# Patient Record
Sex: Male | Born: 1990 | Race: Asian | Hispanic: No | Marital: Single | Smoking: Current every day smoker
Health system: Southern US, Community
[De-identification: ages and names within clinical notes are randomized; demographics above are authoritative.]

---
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
## 2013-02-24 ENCOUNTER — Emergency Department (HOSPITAL_COMMUNITY)
Admission: EM | Admit: 2013-02-24 | Discharge: 2013-02-24 | Disposition: A | Discharge status: Home / Self Care | Payer: BC Managed Care – PPO | Attending: Emergency Medicine | Admitting: Emergency Medicine

## 2013-02-24 ENCOUNTER — Encounter (HOSPITAL_COMMUNITY): Payer: Self-pay | Admitting: Emergency Medicine

## 2013-02-24 ENCOUNTER — Emergency Department (HOSPITAL_COMMUNITY): Payer: BC Managed Care – PPO

## 2013-02-24 DIAGNOSIS — F17200 Nicotine dependence, unspecified, uncomplicated: Secondary | ICD-10-CM | POA: Insufficient documentation

## 2013-02-24 DIAGNOSIS — R269 Unspecified abnormalities of gait and mobility: Secondary | ICD-10-CM | POA: Insufficient documentation

## 2013-02-24 DIAGNOSIS — M25579 Pain in unspecified ankle and joints of unspecified foot: Secondary | ICD-10-CM | POA: Insufficient documentation

## 2013-02-24 DIAGNOSIS — M79672 Pain in left foot: Secondary | ICD-10-CM

## 2013-02-24 MED ORDER — HYDROCODONE-ACETAMINOPHEN 5-325 MG PO TABS: 1.0000 | ORAL_TABLET | Freq: Three times a day (TID) | ORAL | Status: AC

## 2013-02-24 MED ORDER — HYDROCODONE-ACETAMINOPHEN 5-325 MG PO TABS
1.0000 | ORAL_TABLET | Freq: Once | ORAL | Status: AC
  Administered 2013-02-24: 1 via ORAL
  Filled 2013-02-24: qty 1

## 2013-02-24 NOTE — ED Notes (Signed)
C/O pain left medial foot with walking, flexion and extension.No known injury. No deformity noted. Strong pedal pulse.

## 2013-02-24 NOTE — ED Provider Notes (Signed)
CSN: [id number]     Arrival date & time 02/24/13  8657 History  This chart was scribed for non-physician practitioner Mellody Drown, PA-C working with Layla Maw Ward, DO by Joaquin Music, ED Scribe. This patient was seen in room TR10C/TR10C and the patient's care was started at 12:04 PM .   Chief Complaint  Patient presents with  . Ankle Pain   The history is provided by the patient. No language interpreter was used.   HPI Comments: Adam Barton is a 22 y.o. male who presents to the Emergency Department complaining of ongoing L foot and ankle pain that began 4 days ago. He reports Left discomfort located in the L instep of foot radiating to medial ankle. Pt states he was working when his pain initially started. He reports he was was unable to bear weight to L leg due to sharp shooting pain up his L ankle. He states he wears "regular dress shoes for work". Pt states he has been taking OTC Ibuprofen every 8 hours for the past 3 days with little to no relief.  Pt denies any recent traumas/injuries or hx of injuries to L leg. No rash or fever.   Pt states his PCP is Maurice Small.  History reviewed. No pertinent past medical history. History reviewed. No pertinent past surgical history. History reviewed. No pertinent family history. History  Substance Use Topics  . Smoking status: Current Every Day Smoker  . Smokeless tobacco: Not on file  . Alcohol Use: Yes    Review of Systems  Musculoskeletal: Positive for gait problem.   Allergies  Review of patient's allergies indicates no known allergies.  Home Medications   Current Outpatient Rx  Name  Route  Sig  Dispense  Refill  . HYDROcodone-acetaminophen (NORCO/VICODIN) 5-325 MG per tablet   Oral   Take 1 tablet by mouth 3 (three) times daily with meals.   10 tablet   0     BP 120/72  Pulse 67  Temp(Src) 97.4 F (36.3 C) (Oral)  Resp 18  Wt 250 lb (113.399 kg)  SpO2 95%  Physical Exam  Nursing note and  vitals reviewed. Constitutional: He is oriented to person, place, and time. He appears well-developed and well-nourished. No distress.  HENT:  Head: Normocephalic and atraumatic.  Neck: Neck supple.  Pulmonary/Chest: Effort normal. No respiratory distress.  Musculoskeletal:       Left ankle: He exhibits normal range of motion and no swelling. Achilles tendon exhibits no pain.       Left foot: He exhibits normal range of motion, no swelling, no deformity and no laceration.  Non-tender to palpation, no sign of infection.  Neurological: He is oriented to person, place, and time.  Skin: Skin is warm and dry.  Psychiatric: He has a normal mood and affect. His behavior is normal.    ED Course  Procedures  DIAGNOSTIC STUDIES: Oxygen Saturation is 95% on RA, normal by my interpretation.    COORDINATION OF CARE: 12:08 PM-Discussed treatment plan which includes advise pt to ice area, use Ibuprofen for relief, and to use shoes with good arch support. Refer pt to Podiatrist. Advised pt to F/U with PCP. Pt agreed to plan.   Labs Review Labs Reviewed - No data to display Imaging Review No results found.  EKG Interpretation   None       MDM   1. Left foot pain    Pt with left foot pain for several days. No history of injury.  XR-without fracture. Discussed imaging results, and treatment plan with the patient. Return precautions given. Reports understanding and no other concerns at this time.  Patient is stable for discharge at this time.  Meds given in ED:  Medications  HYDROcodone-acetaminophen (NORCO/VICODIN) 5-325 MG per tablet 1 tablet (1 tablet Oral Given 02/24/13 1219)    Discharge Medication List as of 02/24/2013 12:14 PM    START taking these medications   Details  HYDROcodone-acetaminophen (NORCO/VICODIN) 5-325 MG per tablet Take 1 tablet by mouth 3 (three) times daily with meals., Starting 02/24/2013, Until Discontinued, Print        I personally performed the  services described in this documentation, which was scribed in my presence. The recorded information has been reviewed and is accurate.     Clabe Seal, PA-C 02/26/13 [phone]

## 2013-02-24 NOTE — ED Notes (Signed)
Per pt sts for the past few days he has had some worsening ankle pain and selling. sts left ankle. Denies injury

## 2013-02-27 NOTE — ED Provider Notes (Signed)
Medical screening examination/treatment/procedure(s) were performed by non-physician practitioner and as supervising physician I was immediately available for consultation/collaboration.  EKG Interpretation   None         Amaad Byers N Debanhi Blaker, DO 02/27/13 0713 

## 2014-06-29 IMAGING — CR DG ANKLE COMPLETE 3+V*L*
3 series · 3 of 3 positions shown · non-contrast
Comparison: None

CLINICAL DATA: Pain and swelling, no injury, unable to bear weight

EXAM:
LEFT ANKLE COMPLETE - 3+ VIEW

[t ankle joint ap left]
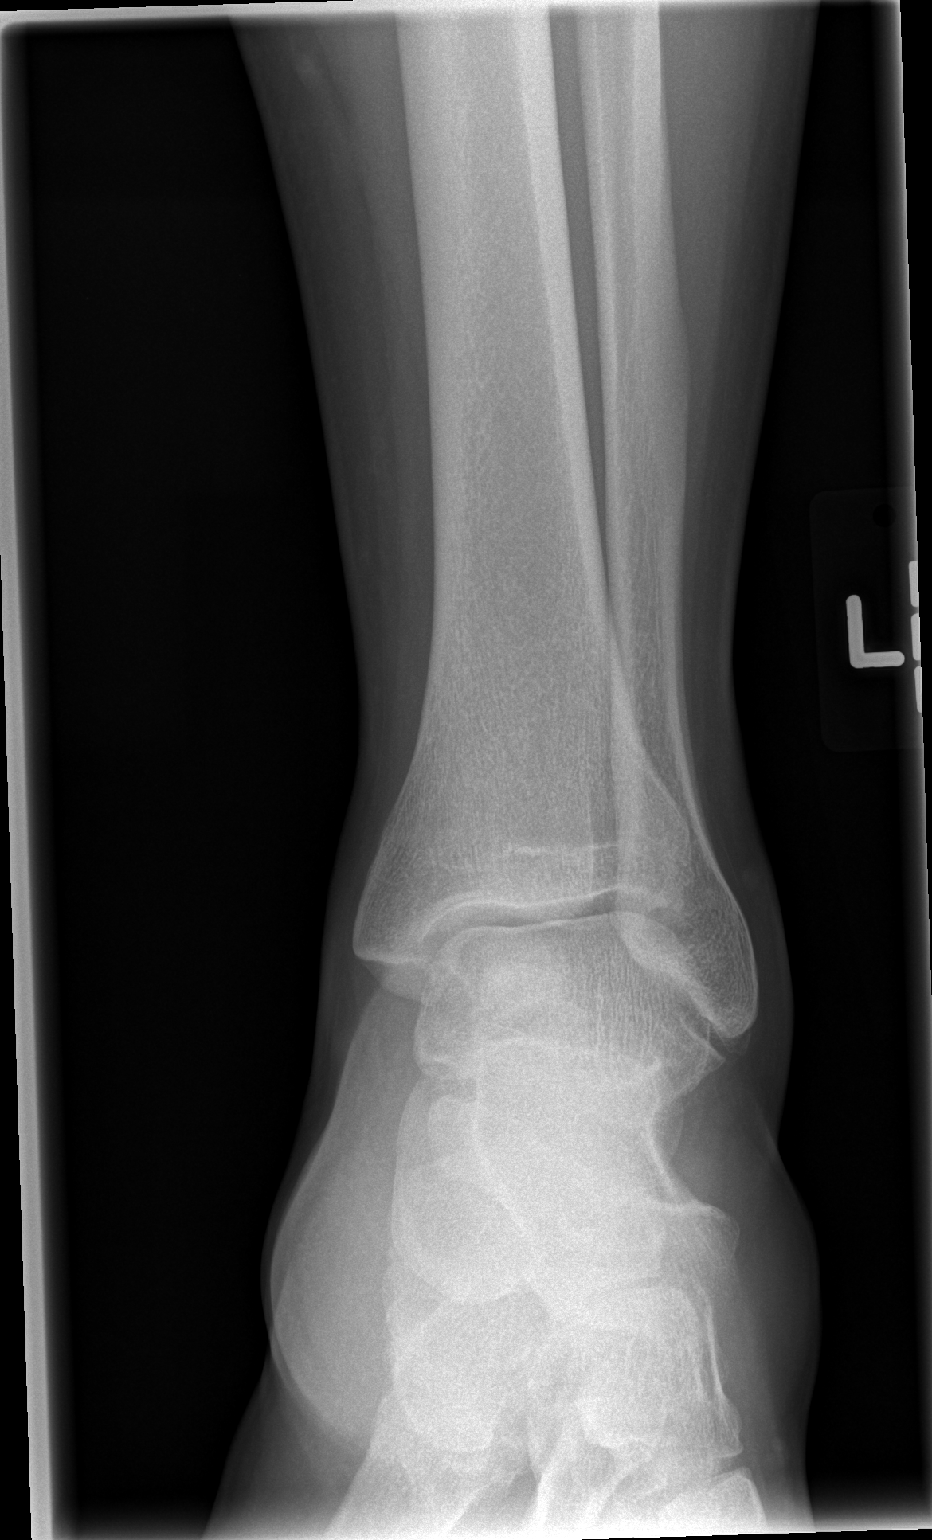

[t ankle joint oblique left]
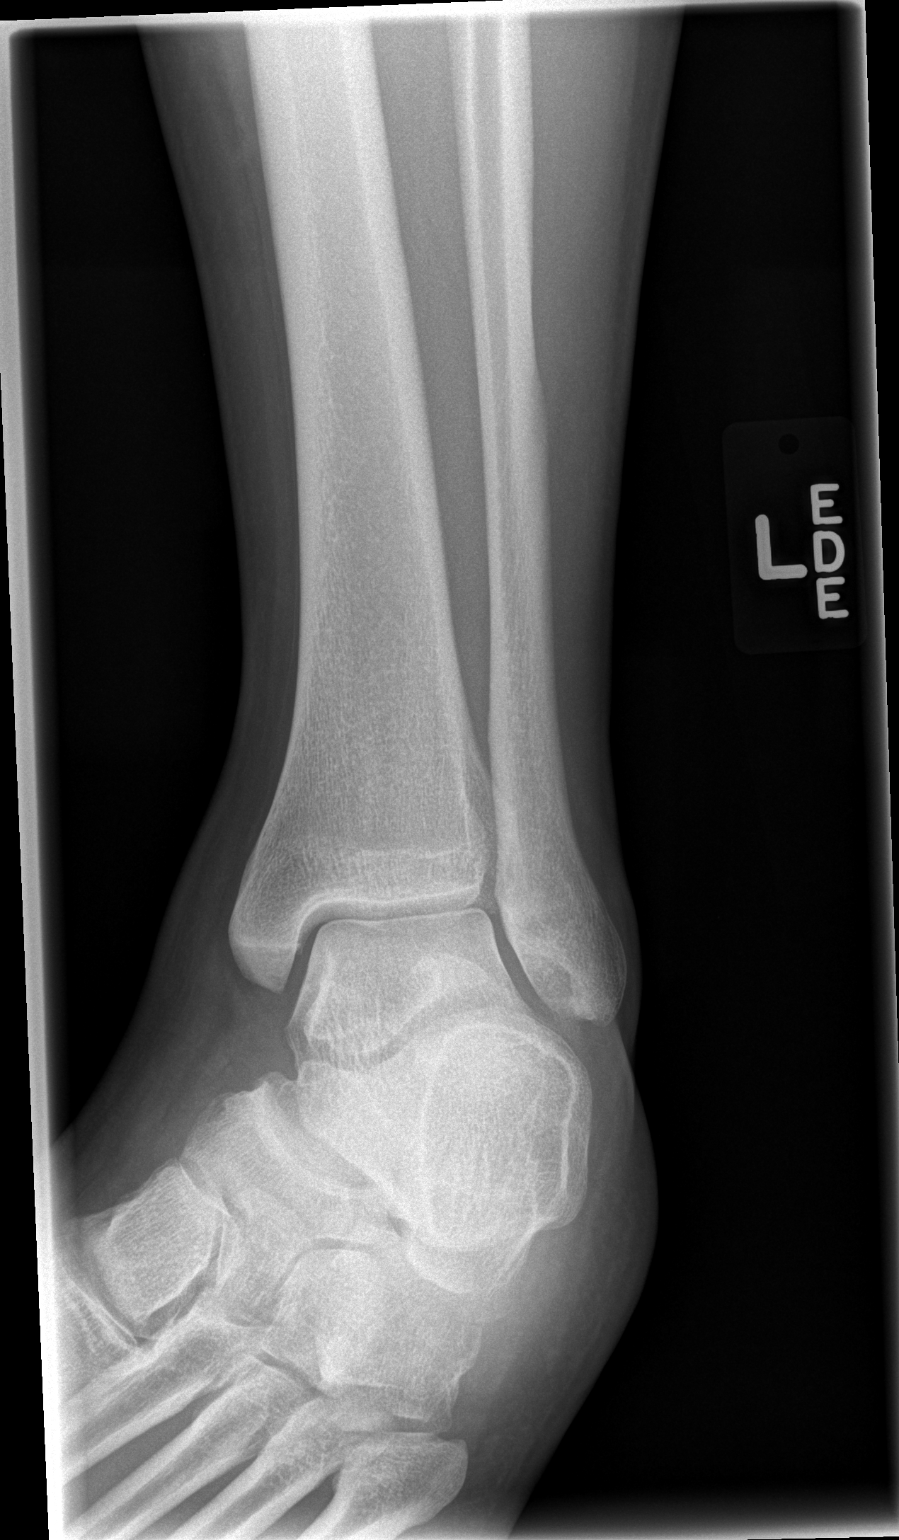

[t ankle joint lat left]
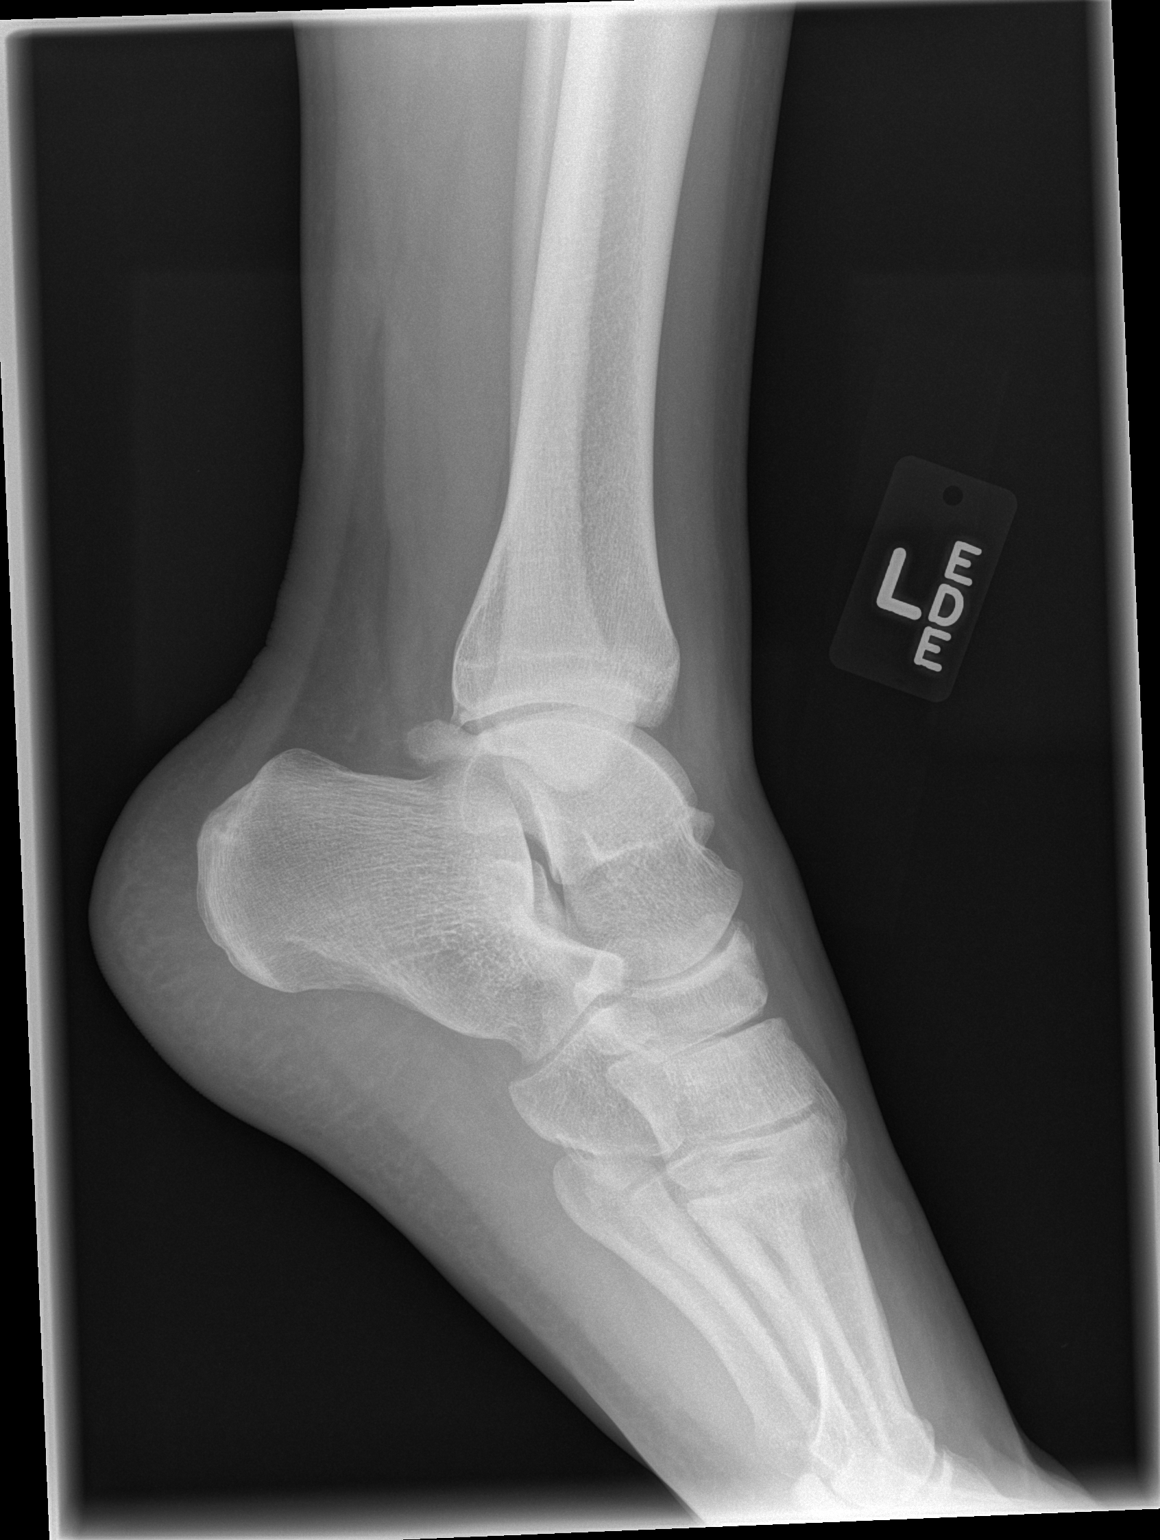

[3 of 3 positions shown; findings below may reference images not displayed]

FINDINGS: Mild soft tissue swelling.

Ankle mortise intact.

Osseous mineralization normal.

No acute fracture, dislocation or bone destruction.
IMPRESSION: No acute osseous abnormalities.

## 2020-03-18 ENCOUNTER — Other Ambulatory Visit: Payer: Self-pay

## 2020-03-18 ENCOUNTER — Emergency Department (HOSPITAL_COMMUNITY)
Admission: EM | Admit: 2020-03-18 | Discharge: 2020-03-18 | Disposition: A | Discharge status: Home / Self Care | Payer: BC Managed Care – PPO | Attending: Emergency Medicine | Admitting: Emergency Medicine

## 2020-03-18 ENCOUNTER — Encounter (HOSPITAL_COMMUNITY): Payer: Self-pay | Admitting: *Deleted

## 2020-03-18 DIAGNOSIS — R197 Diarrhea, unspecified: Secondary | ICD-10-CM | POA: Diagnosis not present

## 2020-03-18 DIAGNOSIS — Z5321 Procedure and treatment not carried out due to patient leaving prior to being seen by health care provider: Secondary | ICD-10-CM | POA: Diagnosis not present

## 2020-03-18 DIAGNOSIS — R5383 Other fatigue: Secondary | ICD-10-CM | POA: Insufficient documentation

## 2020-03-18 LAB — CBC
HCT: 47.4 % (ref 39.0–52.0)
Hemoglobin: 15.7 g/dL (ref 13.0–17.0)
MCH: 29.6 pg (ref 26.0–34.0)
MCHC: 33.1 g/dL (ref 30.0–36.0)
MCV: 89.4 fL (ref 80.0–100.0)
Platelets: 244 10*3/uL (ref 150–400)
RBC: 5.3 MIL/uL (ref 4.22–5.81)
RDW: 12 % (ref 11.5–15.5)
WBC: 5.6 10*3/uL (ref 4.0–10.5)
nRBC: 0 % (ref 0.0–0.2)

## 2020-03-18 LAB — COMPREHENSIVE METABOLIC PANEL
ALT: 53 U/L — ABNORMAL HIGH (ref 0–44)
AST: 35 U/L (ref 15–41)
Albumin: 3.9 g/dL (ref 3.5–5.0)
Alkaline Phosphatase: 82 U/L (ref 38–126)
Anion gap: 10 (ref 5–15)
BUN: 9 mg/dL (ref 6–20)
CO2: 20 mmol/L — ABNORMAL LOW (ref 22–32)
Calcium: 8.8 mg/dL — ABNORMAL LOW (ref 8.9–10.3)
Chloride: 108 mmol/L (ref 98–111)
Creatinine, Ser: 0.79 mg/dL (ref 0.61–1.24)
GFR, Estimated: >60 mL/min (ref >60)
Glucose, Bld: 118 mg/dL — ABNORMAL HIGH (ref 70–99)
Potassium: 3.7 mmol/L (ref 3.5–5.1)
Sodium: 138 mmol/L (ref 135–145)
Total Bilirubin: 0.8 mg/dL (ref 0.3–1.2)
Total Protein: 7.2 g/dL (ref 6.5–8.1)

## 2020-03-18 LAB — LIPASE, BLOOD: Lipase: 70 U/L — ABNORMAL HIGH (ref 11–51)

## 2020-03-18 NOTE — ED Triage Notes (Signed)
Pt is here for continued fatige as well as diarrhea.  Pt states that he took a covid test and this was negative.

## 2020-03-18 NOTE — ED Notes (Signed)
Pt called x3. No naswer
